# Patient Record
Sex: Male | Born: 1980 | Race: Black or African American | Hispanic: No | Marital: Married | Smoking: Never smoker
Health system: Southern US, Community
[De-identification: ages and names within clinical notes are randomized; demographics above are authoritative.]

## PROBLEM LIST (undated history)

## (undated) DIAGNOSIS — I10 Essential (primary) hypertension: Secondary | ICD-10-CM

## (undated) DIAGNOSIS — R011 Cardiac murmur, unspecified: Secondary | ICD-10-CM

## (undated) DIAGNOSIS — J189 Pneumonia, unspecified organism: Secondary | ICD-10-CM

## (undated) HISTORY — PX: BACK SURGERY: SHX140

## (undated) HISTORY — PX: SHOULDER SURGERY: SHX246

---

## 2008-09-29 ENCOUNTER — Emergency Department (HOSPITAL_COMMUNITY): Admission: EM | Admit: 2008-09-29 | Discharge: 2008-09-29 | Payer: Self-pay | Admitting: Emergency Medicine

## 2010-04-24 ENCOUNTER — Encounter: Admission: RE | Admit: 2010-04-24 | Discharge: 2010-04-24 | Payer: Self-pay | Admitting: Chiropractic Medicine

## 2010-11-25 IMAGING — CR DG LUMBAR SPINE 1V
1 series · 1 of 1 positions shown · non-contrast
Comparison: None.

CLINICAL DATA: Lumbago, cervical radiculitis

LUMBAR SPINE - 1 VIEW

[w l-spine lat]
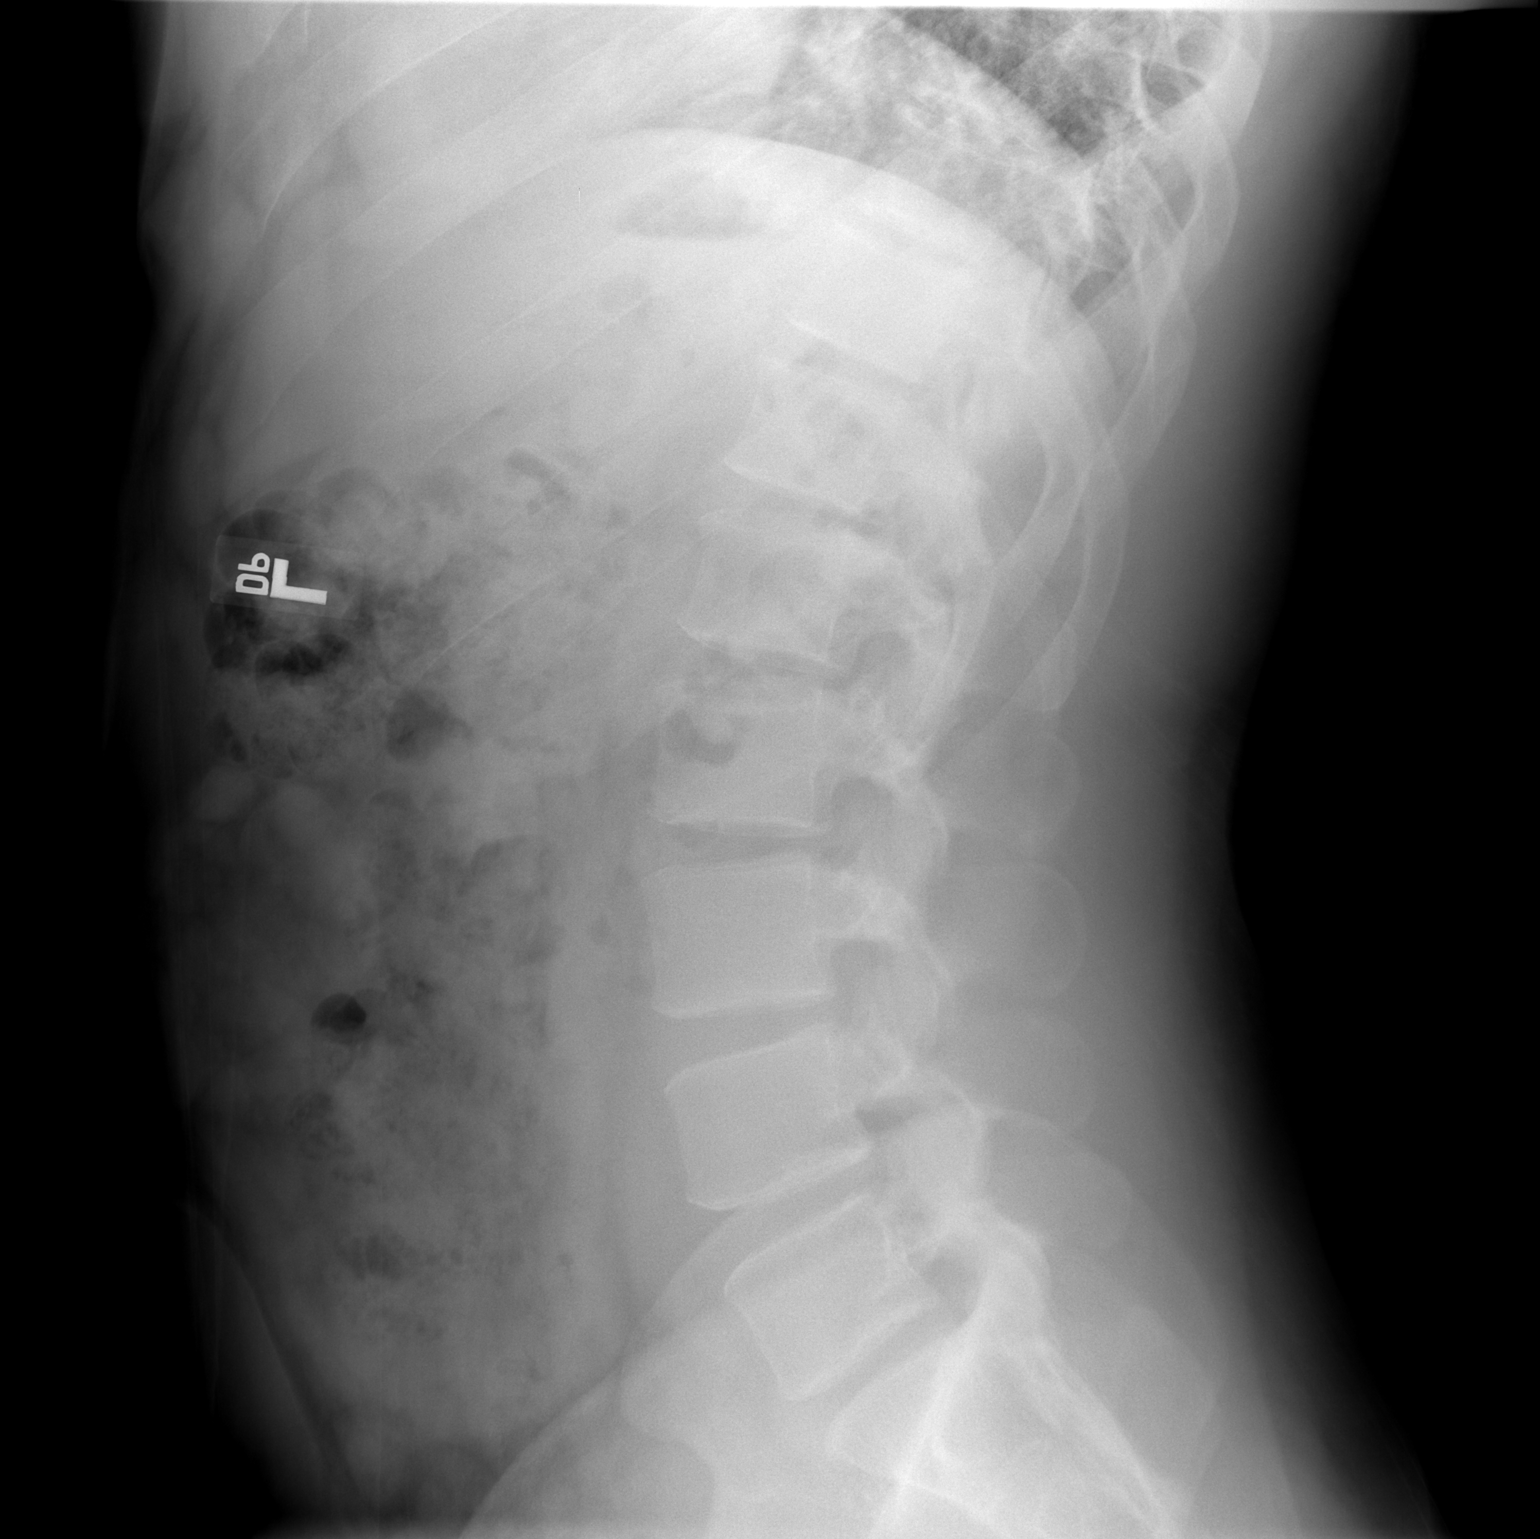

[1 of 1 positions shown; findings below may reference images not displayed]

FINDINGS: Normal alignment of the lumbar vertebral bodies.  No loss
of vertebral body height or disc height.
IMPRESSION: No acute or chronic findings of the lumbar spine.

## 2013-08-22 ENCOUNTER — Encounter (HOSPITAL_COMMUNITY): Payer: Self-pay | Admitting: Emergency Medicine

## 2013-08-22 ENCOUNTER — Other Ambulatory Visit: Payer: Self-pay

## 2013-08-22 ENCOUNTER — Emergency Department (HOSPITAL_COMMUNITY)
Admission: EM | Admit: 2013-08-22 | Discharge: 2013-08-22 | Disposition: A | Discharge status: Home / Self Care | Payer: BC Managed Care – PPO | Attending: Emergency Medicine | Admitting: Emergency Medicine

## 2013-08-22 ENCOUNTER — Emergency Department (HOSPITAL_COMMUNITY): Payer: BC Managed Care – PPO

## 2013-08-22 DIAGNOSIS — J069 Acute upper respiratory infection, unspecified: Secondary | ICD-10-CM | POA: Insufficient documentation

## 2013-08-22 DIAGNOSIS — R059 Cough, unspecified: Secondary | ICD-10-CM | POA: Insufficient documentation

## 2013-08-22 DIAGNOSIS — R51 Headache: Secondary | ICD-10-CM | POA: Insufficient documentation

## 2013-08-22 DIAGNOSIS — J3489 Other specified disorders of nose and nasal sinuses: Secondary | ICD-10-CM | POA: Insufficient documentation

## 2013-08-22 DIAGNOSIS — Z8701 Personal history of pneumonia (recurrent): Secondary | ICD-10-CM | POA: Insufficient documentation

## 2013-08-22 DIAGNOSIS — R05 Cough: Secondary | ICD-10-CM | POA: Insufficient documentation

## 2013-08-22 DIAGNOSIS — R509 Fever, unspecified: Secondary | ICD-10-CM | POA: Insufficient documentation

## 2013-08-22 DIAGNOSIS — R0602 Shortness of breath: Secondary | ICD-10-CM | POA: Diagnosis present

## 2013-08-22 DIAGNOSIS — I10 Essential (primary) hypertension: Secondary | ICD-10-CM | POA: Insufficient documentation

## 2013-08-22 DIAGNOSIS — R011 Cardiac murmur, unspecified: Secondary | ICD-10-CM | POA: Insufficient documentation

## 2013-08-22 HISTORY — DX: Pneumonia, unspecified organism: J18.9

## 2013-08-22 HISTORY — DX: Essential (primary) hypertension: I10

## 2013-08-22 HISTORY — DX: Cardiac murmur, unspecified: R01.1

## 2013-08-22 MED ORDER — ACETAMINOPHEN 325 MG PO TABS
650.0000 mg | ORAL_TABLET | Freq: Once | ORAL | Status: AC
  Administered 2013-08-22: 650 mg via ORAL
  Filled 2013-08-22: qty 2

## 2013-08-22 MED ORDER — ALBUTEROL SULFATE HFA 108 (90 BASE) MCG/ACT IN AERS
4.0000 | INHALATION_SPRAY | Freq: Once | RESPIRATORY_TRACT | Status: AC
  Administered 2013-08-22: 4 via RESPIRATORY_TRACT
  Filled 2013-08-22: qty 6.7

## 2013-08-22 MED ORDER — ALBUTEROL SULFATE HFA 108 (90 BASE) MCG/ACT IN AERS
1.0000 | INHALATION_SPRAY | Freq: Four times a day (QID) | RESPIRATORY_TRACT | Status: AC | PRN
Start: 2013-08-22 — End: ?

## 2013-08-22 NOTE — ED Provider Notes (Signed)
CSN: [id number]     Arrival date & time 08/22/13  0117 History   First MD Initiated Contact with Patient 08/22/13 [phone]     Chief Complaint  Patient presents with  . Shortness of Breath   (Consider location/radiation/quality/duration/timing/severity/associated sxs/prior Treatment) Patient is a 32 y.o. male presenting with shortness of breath. The history is provided by the patient.  Shortness of Breath Severity:  Mild Onset quality:  Gradual Duration:  4 hours Timing:  Constant Progression:  Unchanged Chronicity:  New Context comment:  While in bed Relieved by:  Nothing Worsened by:  Nothing tried Ineffective treatments: amoxil, nasal steroids. Associated symptoms: cough, fever (low grade at home) and headaches   Associated symptoms: no abdominal pain, no chest pain, no neck pain and no vomiting     Past Medical History  Diagnosis Date  . Pneumonia   . Heart murmur   . Hypertension    Past Surgical History  Procedure Laterality Date  . Shoulder surgery    . Back surgery     History reviewed. No pertinent family history. History  Substance Use Topics  . Smoking status: Never Smoker   . Smokeless tobacco: Not on file  . Alcohol Use: Yes     Comment: rarely    Review of Systems  Constitutional: Positive for fever (low grade at home).  HENT: Positive for congestion. Negative for rhinorrhea, drooling and neck pain.   Eyes: Negative for pain.  Respiratory: Positive for cough and shortness of breath.   Cardiovascular: Negative for chest pain and leg swelling.  Gastrointestinal: Negative for nausea, vomiting, abdominal pain and diarrhea.  Genitourinary: Negative for dysuria and hematuria.  Musculoskeletal: Negative for gait problem.  Skin: Negative for color change.  Neurological: Positive for headaches. Negative for numbness.  Hematological: Negative for adenopathy.  Psychiatric/Behavioral: Negative for behavioral problems.  All other systems reviewed and are  negative.    Allergies  Review of patient's allergies indicates no known allergies.  Home Medications   Current Outpatient Rx  Name  Route  Sig  Dispense  Refill  . amLODipine (NORVASC) 5 MG tablet               . methocarbamol (ROBAXIN) 500 MG tablet                BP 148/82  Pulse 60  Temp(Src) 98.2 F (36.8 C) (Oral)  Resp 20  Ht 6' (1.829 m)  Wt 250 lb (113.399 kg)  BMI 33.9 kg/m2  SpO2 97% Physical Exam  Nursing note and vitals reviewed. Constitutional: He is oriented to person, place, and time. He appears well-developed and well-nourished.  HENT:  Head: Normocephalic and atraumatic.  Right Ear: External ear normal.  Left Ear: External ear normal.  Nose: Nose normal.  Mouth/Throat: Oropharynx is clear and moist. No oropharyngeal exudate.  Eyes: Conjunctivae and EOM are normal. Pupils are equal, round, and reactive to light.  Neck: Normal range of motion. Neck supple.  Cardiovascular: Normal rate, regular rhythm, normal heart sounds and intact distal pulses.  Exam reveals no gallop and no friction rub.   No murmur heard. Pulmonary/Chest: Effort normal and breath sounds normal. No respiratory distress. He has no wheezes.  Abdominal: Soft. Bowel sounds are normal. He exhibits no distension. There is no tenderness. There is no rebound and no guarding.  Musculoskeletal: Normal range of motion. He exhibits no edema and no tenderness.  Neurological: He is alert and oriented to person, place, and time.  Skin: Skin is  warm and dry.  Psychiatric: He has a normal mood and affect. His behavior is normal.    ED Course  Procedures (including critical care time) Labs Review Labs Reviewed - No data to display Imaging Review Dg Chest 2 View  08/22/2013   *RADIOLOGY REPORT*  Clinical Data: Shortness of breath, fever, weakness.  CHEST - 2 VIEW  Comparison: 09/29/2008  Findings: Volume loss in the right lung with mediastinal shift towards the right.  This appearance is  stable since the previous chest radiograph.  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  There is no evidence of acute airspace disease or consolidation in the lungs.  No blunting of costophrenic angles.  No pneumothorax.  Normal heart size and pulmonary vascularity.  IMPRESSION: Stable appearance of chronic changes in the chest.  No evidence of active pulmonary disease.   Original Report Authenticated By: Burman Nieves, M.D.     Date: 08/22/2013  Rate: 70  Rhythm: normal sinus rhythm  QRS Axis: normal  Intervals: normal  ST/T Wave abnormalities: nonspecific T wave changes  Conduction Disutrbances:none  Narrative Interpretation: Non specific t wave changes in inferior leads and V5-V6  Old EKG Reviewed: none available   MDM   1. SOB (shortness of breath)   2. URI (upper respiratory infection)    2:11 AM 32 y.o. male who presents with URI symptoms for the last 4 days and mild shortness of breath that began while he was at rest this evening. He also notes mild headache, 2/10 on exam now. The patient is afebrile and his vital signs are unremarkable here. He appears well on exam. He does have a history of reactive airway disease as a child. Lungs are clear on my exam but will give albuterol to see if the patient gets relief. Will get chest x-ray and Tylenol for mild headache. Wells/Perc neg. Suspect viral URI as the cause of his sob.   3:28 AM: Pt feeling mildly better. CXR non-contrib. Will provide Rx for alb inhaler as pt had reactive airway dis as a child and may have component of this in setting of URI. I have discussed the diagnosis/risks/treatment options with the patient and family and believe the pt to be eligible for discharge home to follow-up with pcp as needed. We also discussed returning to the ED immediately if new or worsening sx occur. We discussed the sx which are most concerning (e.g., worsening sob) that necessitate immediate return.  Any new prescriptions provided to the patient are listed below.  New Prescriptions   ALBUTEROL (PROVENTIL HFA;VENTOLIN HFA) 108 (90 BASE) MCG/ACT INHALER    Inhale 1-2 puffs into the lungs every 6 (six) hours as needed for wheezing.     Junius Argyle, MD 08/22/13 [phone]

## 2013-08-22 NOTE — ED Notes (Addendum)
Pt states he awoke tonight coughing, productive, white in color. +headache. Pt reports fever of 100 earlier. Pt currently taking antbx for URI, pt took muscle relaxer tonight for neck pain.

## 2013-08-22 NOTE — ED Notes (Signed)
Pt observed walking into room without difficulty.

## 2014-03-25 IMAGING — CR DG CHEST 2V
2 series · 2 of 2 positions shown · non-contrast
Comparison: 09/29/2008

CLINICAL DATA: Shortness of breath, fever, weakness.

CHEST - 2 VIEW

[w chest pa]
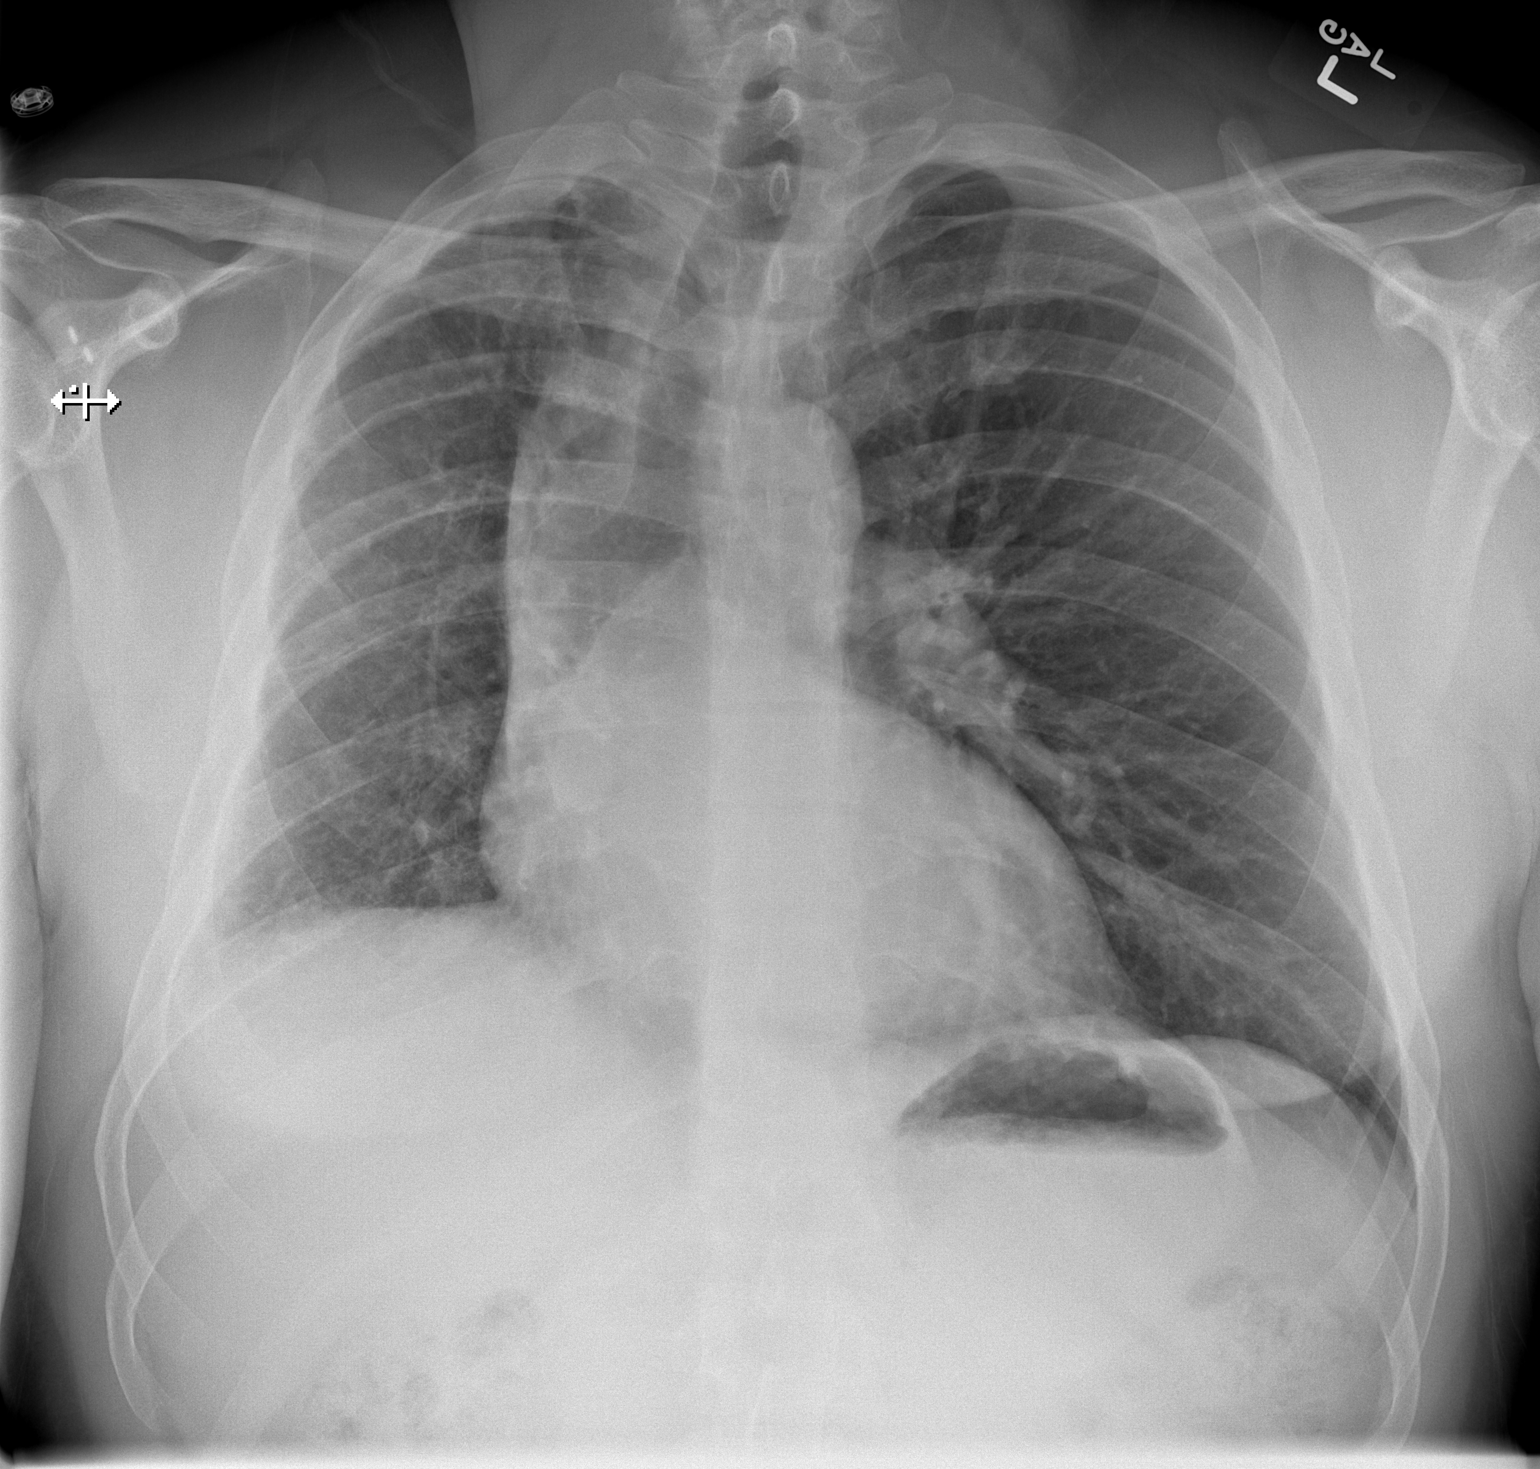

[w chest lat]
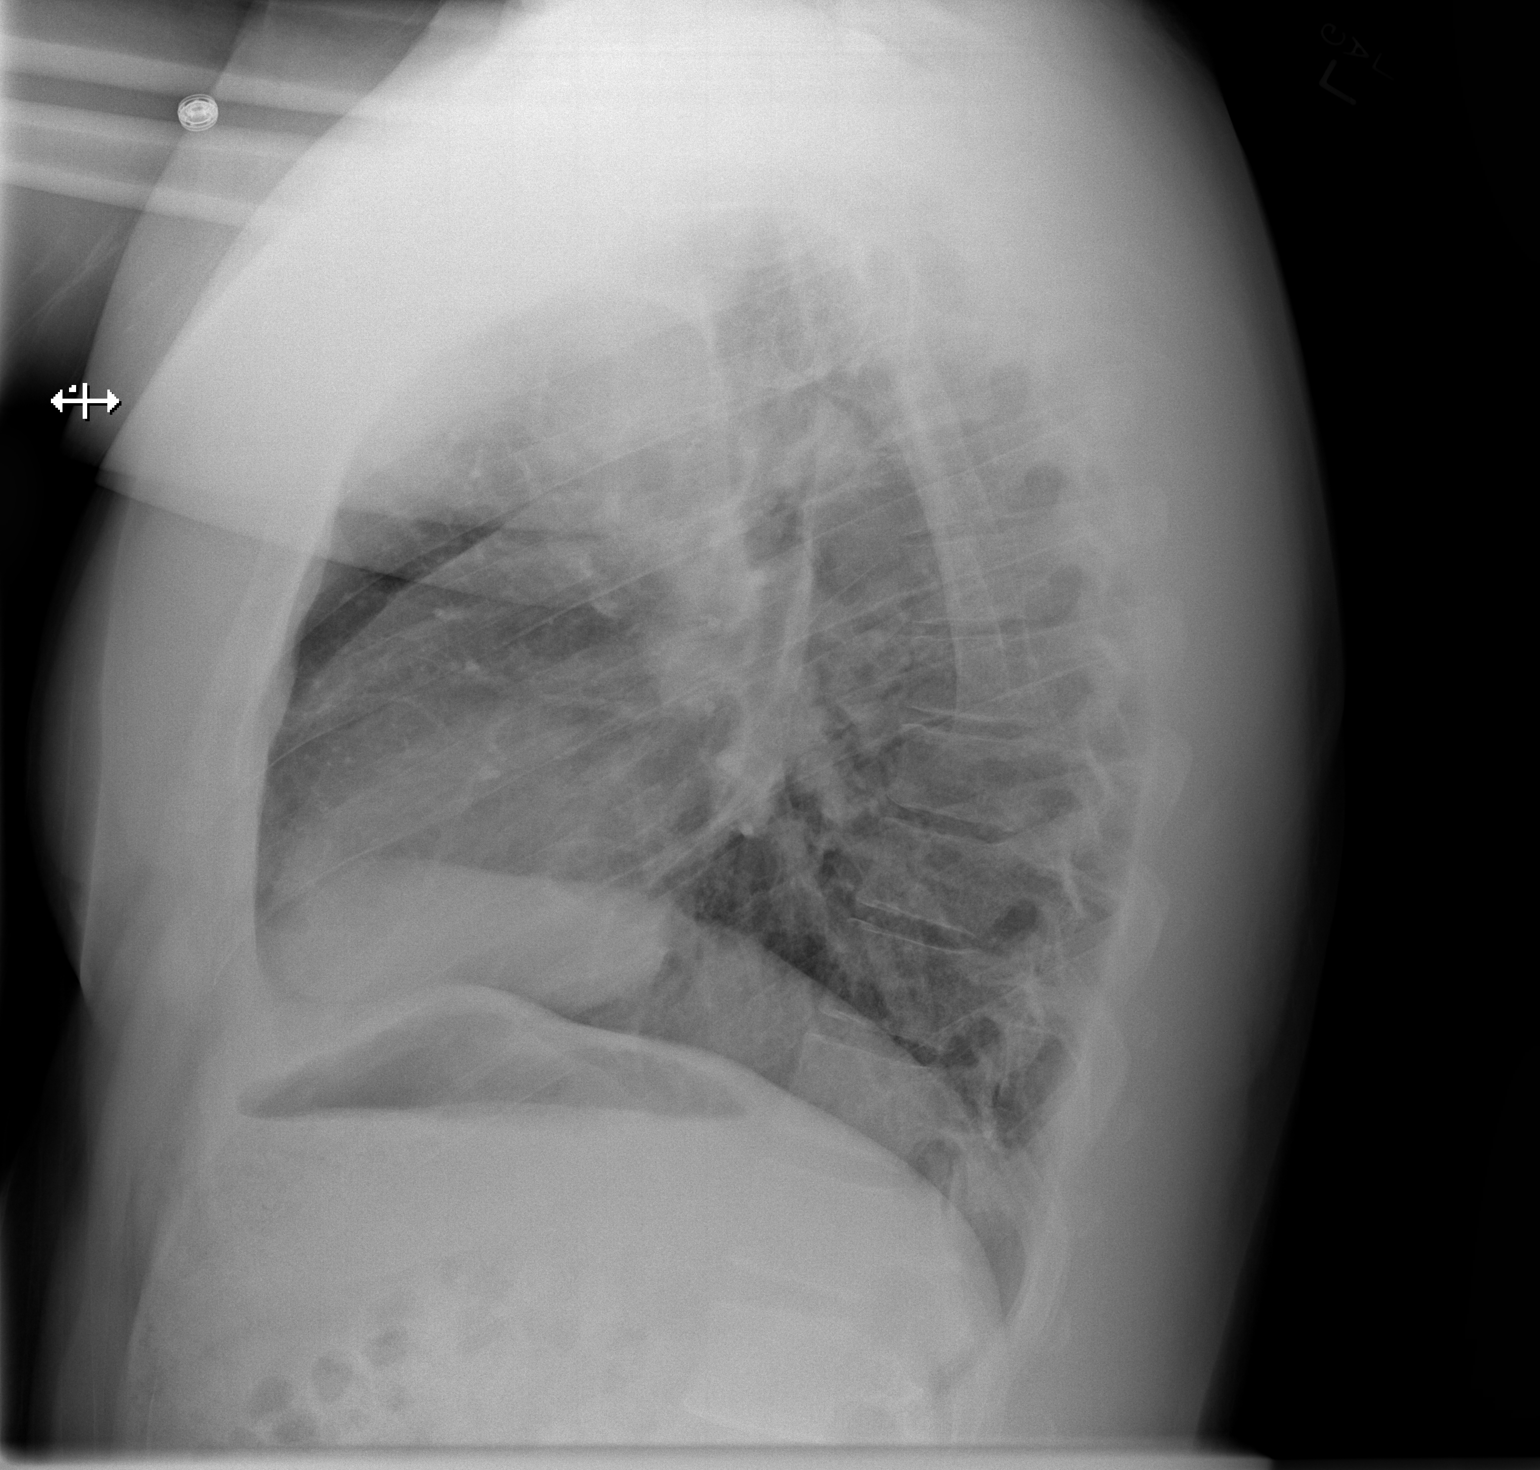

[2 of 2 positions shown; findings below may reference images not displayed]

FINDINGS: Volume loss in the right lung with mediastinal shift
towards the right.  This appearance is stable since the previous
chest radiograph.  Previous CT chest demonstrated a congenital
interruption of the right pulmonary artery.  There is no evidence
of acute airspace disease or consolidation in the lungs.  No
blunting of costophrenic angles.  No pneumothorax.  Normal heart
size and pulmonary vascularity.
IMPRESSION: Stable appearance of chronic changes in the chest.  No evidence of
active pulmonary disease.

## 2015-02-20 ENCOUNTER — Telehealth: Payer: Self-pay

## 2015-02-20 NOTE — Telephone Encounter (Signed)
Patient is concerned that he may have shingles.  He would like a call from a clinical staff member to ask for advise on what to do, if he should come in, etc.  CB#: [phone]

## 2015-02-21 NOTE — Telephone Encounter (Signed)
Pt was seen by a physician.

## 2017-06-08 ENCOUNTER — Other Ambulatory Visit: Payer: Self-pay | Admitting: Family Medicine

## 2017-06-08 DIAGNOSIS — R7989 Other specified abnormal findings of blood chemistry: Secondary | ICD-10-CM

## 2017-06-08 DIAGNOSIS — R945 Abnormal results of liver function studies: Principal | ICD-10-CM

## 2017-06-19 ENCOUNTER — Other Ambulatory Visit: Payer: Self-pay

## 2017-06-19 ENCOUNTER — Ambulatory Visit
Admission: RE | Admit: 2017-06-19 | Discharge: 2017-06-19 | Disposition: A | Discharge status: Home / Self Care | Payer: BLUE CROSS/BLUE SHIELD | Source: Ambulatory Visit | Attending: Family Medicine | Admitting: Family Medicine

## 2017-06-19 DIAGNOSIS — R7989 Other specified abnormal findings of blood chemistry: Secondary | ICD-10-CM

## 2017-06-19 DIAGNOSIS — R945 Abnormal results of liver function studies: Principal | ICD-10-CM
